# Patient Record
Sex: Female | Born: 1995 | Race: Black or African American | Hispanic: No | Smoking: Never smoker
Health system: Southern US, Community
[De-identification: ages and names within clinical notes are randomized; demographics above are authoritative.]

## PROBLEM LIST (undated history)

## (undated) DIAGNOSIS — E119 Type 2 diabetes mellitus without complications: Secondary | ICD-10-CM

## (undated) HISTORY — PX: TONSILLECTOMY: SHX5618

## (undated) HISTORY — PX: CHOLECYSTECTOMY: SHX55

---

## 2018-11-06 ENCOUNTER — Other Ambulatory Visit: Payer: Self-pay

## 2018-11-06 ENCOUNTER — Emergency Department (HOSPITAL_COMMUNITY)
Admission: EM | Admit: 2018-11-06 | Discharge: 2018-11-07 | Disposition: A | Discharge status: Home / Self Care | Payer: PRIVATE HEALTH INSURANCE | Attending: Emergency Medicine | Admitting: Emergency Medicine

## 2018-11-06 ENCOUNTER — Emergency Department (HOSPITAL_COMMUNITY): Payer: PRIVATE HEALTH INSURANCE

## 2018-11-06 ENCOUNTER — Encounter (HOSPITAL_COMMUNITY): Payer: Self-pay | Admitting: Emergency Medicine

## 2018-11-06 DIAGNOSIS — E119 Type 2 diabetes mellitus without complications: Secondary | ICD-10-CM | POA: Insufficient documentation

## 2018-11-06 DIAGNOSIS — R0789 Other chest pain: Secondary | ICD-10-CM | POA: Diagnosis present

## 2018-11-06 DIAGNOSIS — R5383 Other fatigue: Secondary | ICD-10-CM | POA: Diagnosis not present

## 2018-11-06 DIAGNOSIS — R0602 Shortness of breath: Secondary | ICD-10-CM | POA: Insufficient documentation

## 2018-11-06 HISTORY — DX: Type 2 diabetes mellitus without complications: E11.9

## 2018-11-06 LAB — CBC
HCT: 39 % (ref 36.0–46.0)
Hemoglobin: 12.3 g/dL (ref 12.0–15.0)
MCH: 27.7 pg (ref 26.0–34.0)
MCHC: 31.5 g/dL (ref 30.0–36.0)
MCV: 87.8 fL (ref 80.0–100.0)
Platelets: 479 10*3/uL — ABNORMAL HIGH (ref 150–400)
RBC: 4.44 MIL/uL (ref 3.87–5.11)
RDW: 14.4 % (ref 11.5–15.5)
WBC: 11 10*3/uL — ABNORMAL HIGH (ref 4.0–10.5)
nRBC: 0 % (ref 0.0–0.2)

## 2018-11-06 MED ORDER — SODIUM CHLORIDE 0.9% FLUSH: 3.0000 mL | Freq: Once | INTRAVENOUS | Status: DC

## 2018-11-06 NOTE — ED Triage Notes (Signed)
Patient reports intermittent upper chest pain with mild SOB onset this week , pain increases with deep inspiration , denies emesis or diaphoresis , patient added RUQ abdominal pain today .

## 2018-11-07 LAB — LIPASE, BLOOD: Lipase: 30 U/L (ref 11–51)

## 2018-11-07 LAB — BASIC METABOLIC PANEL
Anion gap: 9 (ref 5–15)
BUN: 9 mg/dL (ref 6–20)
CO2: 23 mmol/L (ref 22–32)
Calcium: 9.3 mg/dL (ref 8.9–10.3)
Chloride: 104 mmol/L (ref 98–111)
Creatinine, Ser: 0.72 mg/dL (ref 0.44–1.00)
GFR calc Af Amer: >60 mL/min (ref >60)
GFR calc non Af Amer: >60 mL/min (ref >60)
Glucose, Bld: 80 mg/dL (ref 70–99)
Potassium: 3.5 mmol/L (ref 3.5–5.1)
Sodium: 136 mmol/L (ref 135–145)

## 2018-11-07 LAB — I-STAT BETA HCG BLOOD, ED (MC, WL, AP ONLY): I-stat hCG, quantitative: <5 m[IU]/mL (ref <5)

## 2018-11-07 LAB — PROTIME-INR
INR: 1 (ref 0.8–1.2)
Prothrombin Time: 13.5 seconds (ref 11.4–15.2)

## 2018-11-07 LAB — TROPONIN I (HIGH SENSITIVITY): Troponin I (High Sensitivity): 4 ng/L (ref <18)

## 2018-11-07 NOTE — ED Provider Notes (Signed)
[facility] EMERGENCY DEPARTMENT Provider Note   CSN: [id number] Arrival date & time: 11/06/18  2209     History   Chief Complaint Chief Complaint  Patient presents with  . Chest Pain  . Abdominal Pain    HPI Nicole Stout is a 23 y.o. female with no past medical history presents for 1 week of fatigue with 2 days of chest tightness that is constant, gradual in onset, mild discomfort, associated shortness of breath unchanged with exertion.  Chest pain is worse in certain positions and with certain movements.  Better with Tylenol.   Patient states that she has felt generally fatigued over the past week and temperature regularly and states that at 1 point it was 99.2.  Patient states last Tylenol was yesterday afternoon.  Patient also endorses right upper quadrant discomfort that is constant, achy and mild.  Patient denies any nausea, vomiting, diarrhea, denies any pain with walking.   Patient had a cholecystectomy 6 years ago.  No other abdominal surgeries last bowel movement was today no blood or unusual appearance of stool.  No pain with defecation.  No urinary symptoms, hematuria, burning of urination, change in frequency, no pelvic or vaginal pain, no concern for STI or UTI.  Patient states she has not eaten since being in the ED however she is able to tolerate drinking and eating and denies any pain associated with oral intake.  Patient states she is gratitude and has felt stressed due to school recently.  She states that it is a difficult time and that her schedule is stressful for her.    HPI  Past Medical History:  Diagnosis Date  . Diabetes mellitus without complication (HCC)     There are no active problems to display for this patient.   History reviewed. No pertinent surgical history.   OB History   No obstetric history on file.      Home Medications    Prior to Admission medications   Medication Sig Start Date End Date Taking?  Authorizing Provider  BIOTIN PO Take 1 tablet by mouth daily.   Yes [provider]  cholecalciferol (VITAMIN D3) 25 MCG (1000 UT) tablet Take 1,000 Units by mouth daily.   Yes [provider]  ELDERBERRY PO Take 1 tablet by mouth daily.   Yes [provider]  TRI-LO-MARZIA 0.18/0.215/0.25 MG-25 MCG tab Take 1 tablet by mouth daily. 09/20/18  Yes [provider]  vitamin B-12 (CYANOCOBALAMIN) 1000 MCG tablet Take 1,000 mcg by mouth daily.   Yes [provider]    Family History No family history on file.  Social History Social History   Tobacco Use  . Smoking status: Never Smoker  . Smokeless tobacco: Never Used  Substance Use Topics  . Alcohol use: Never    Frequency: Never  . Drug use: Never     Allergies   Patient has no known allergies.   Review of Systems Review of Systems  All other systems reviewed and are negative.    Physical Exam Updated Vital Signs BP 129/73   Pulse (!) 59   Temp 98.3 F (36.8 C) (Oral)   Resp 18   LMP 09/22/2018 (Approximate)   SpO2 100%    Vitals:   11/07/18 0715 11/07/18 0745  BP: (!) 158/94 (!) 135/93  Pulse: 76 76  Resp: 15 20  Temp:    SpO2: 100% 100%     Physical Exam Vitals signs and nursing note reviewed.  Constitutional:  General: She is not in acute distress.    Appearance: She is obese. She is not ill-appearing.  HENT:     Head: Normocephalic and atraumatic.     Nose: Nose normal.     Mouth/Throat:     Mouth: Mucous membranes are moist.  Eyes:     General: No scleral icterus. Neck:     Musculoskeletal: Normal range of motion. No neck rigidity.  Cardiovascular:     Rate and Rhythm: Normal rate and regular rhythm.     Pulses: Normal pulses.     Heart sounds: Normal heart sounds. No murmur. No friction rub. No gallop.   Pulmonary:     Effort: Pulmonary effort is normal. No respiratory distress.     Breath sounds: Normal breath sounds. No wheezing, rhonchi or  rales.  Chest:     Chest wall: No tenderness.  Abdominal:     General: Abdomen is flat. Bowel sounds are normal. There is no distension.     Palpations: Abdomen is soft.     Tenderness: There is no abdominal tenderness. There is no right CVA tenderness, left CVA tenderness, guarding or rebound.  Musculoskeletal:     Right lower leg: No edema.     Left lower leg: No edema.     Comments: Has reproducible chest wall tenderness over the sternum and left-sided pectoral muscle.  Skin:    General: Skin is warm and dry.     Capillary Refill: Capillary refill takes less than 2 seconds.  Neurological:     Mental Status: She is alert. Mental status is at baseline.  Psychiatric:        Mood and Affect: Mood normal.        Behavior: Behavior normal.      ED Treatments / Results  Labs (all labs ordered are listed, but only abnormal results are displayed) Labs Reviewed  CBC - Abnormal; Notable for the following components:      Result Value   WBC 11.0 (*)    Platelets 479 (*)    All other components within normal limits  BASIC METABOLIC PANEL  PROTIME-INR  LIPASE, BLOOD  I-STAT BETA HCG BLOOD, ED ([facility], [facility], [facility] ONLY)  TROPONIN I (HIGH SENSITIVITY)  TROPONIN I (HIGH SENSITIVITY)    EKG EKG Interpretation  Date/Time:  Tuesday November 06 2018 22:19:36 EDT Ventricular Rate:  84 PR Interval:  146 QRS Duration: 86 QT Interval:  360 QTC Calculation: 425 R Axis:   88 Text Interpretation:  Normal sinus rhythm Normal ECG NO STEMI. No old tracing to compare Confirmed by Drema Pryardama, Pedro [phone]([id number]) on 11/07/2018 3:11:23 AM   Radiology Dg Chest 2 View  Result Date: 11/06/2018 CLINICAL DATA:  23 year old female with chest pain. EXAM: CHEST - 2 VIEW COMPARISON:  None. FINDINGS: The heart size and mediastinal contours are within normal limits. Both lungs are clear. The visualized skeletal structures are unremarkable. IMPRESSION: No active cardiopulmonary disease. Electronically Signed   By: Elgie CollardArash   Radparvar M.D.   On: 11/06/2018 23:08    Procedures Procedures (including critical care time)  Medications Ordered in ED Medications  sodium chloride flush (NS) 0.9 % injection 3 mL (has no administration in time range)     Initial Impression / Assessment and Plan / ED Course  I have reviewed the triage vital signs and the nursing notes.  Pertinent labs & imaging results that were available during my care of the patient were reviewed by me and considered in my medical decision making (see  chart for details).       Patient is a healthy 23 year old female with gradual onset of fatigue, chest pain and abdominal pain over the past week.   Lab work shows normal electrolytes, patient is nonpregnant, lipase within normal limits, troponin within normal limits, WBC barely mildly elevated at 11.0, this x-ray shows no cardiopulmonary disease, no enlarging of heart, no focal consolidation, EKG is unremarkable with no concerning features, no need for second troponin.  Patient has no cardiac risk factors other than family history of daily cardiomyopathy.   Has reassuring physical exam reproducible chest wall tenderness and patient is a non-smoker, doubt PE as patient has no unilateral leg swelling, was not taking her birth control when symptoms began, no history of DVT, no cough or hemoptysis.   Abdominal pain is nonfocal on exam and patient has had a cholecystectomy.  Doubt pneumonia, patient is not having fevers and chest x-ray is within normal limits.  Discussed plan to discharge patient with close up with primary care with patient.  Patient is agreeable to plan patient will use ibuprofen for discomfort in the meantime.  States she will return promptly to the ED if she has worsening shortness of breath, worsening abdominal pain, fever, or any concerning symptoms.       The patient appears reasonably screened and/or stabilized for discharge and I doubt any other medical condition or other [facility]  requiring further screening, evaluation, or treatment in the ED at this time prior to discharge.  Patient is hemodynamically stable, in NAD, and able to ambulate in the ED. Pain has been managed or a plan has been made for home management and has no complaints prior to discharge. Patient is comfortable with above plan and is stable for discharge at this time. All questions were answered prior to disposition. Results from the ER workup discussed with the patient face to face and all questions answered to the best of my ability. The patient is safe for discharge with strict return precautions. Patient appears safe for discharge with appropriate follow-up.  Conveyed my impression with the patient and he voiced understanding and is agreeable to plan.   An After Visit Summary was printed and given to the patient.  Portions of this note were generated with [employer]. Dictation errors may occur despite best attempts at proofreading.      Final Clinical Impressions(s) / ED Diagnoses   Final diagnoses:  None    ED Discharge Orders    None       Tedd Sias, [place] 11/07/18 1751    Quintella Reichert, MD 11/09/18 [phone]

## 2018-11-07 NOTE — ED Notes (Signed)
2nd Troponin discontinued per EDP.

## 2018-11-07 NOTE — ED Notes (Signed)
Pt not in room.

## 2018-11-07 NOTE — Discharge Instructions (Addendum)
Please call your insurance to find a primary care provider who is within your network.  Please establish care next week to have a care.  Call  wellness (attached information) if you are unable to make an appointment within the next week with primary care.  Please return to ED if you have any worsening shortness of breath, fevers, vomiting, worsening or concerning symptoms.  Take ibuprofen and tylenol for pain and monitor symptoms closely. Please return if concerned.

## 2018-11-07 NOTE — ED Notes (Signed)
Attempted IV stick times 2 and unsuccessful. Patient may need US guided IV placement

## 2019-04-16 ENCOUNTER — Ambulatory Visit (HOSPITAL_COMMUNITY)
Admission: EM | Admit: 2019-04-16 | Discharge: 2019-04-16 | Disposition: A | Discharge status: Home / Self Care | Payer: PRIVATE HEALTH INSURANCE

## 2019-04-16 ENCOUNTER — Other Ambulatory Visit: Payer: Self-pay

## 2019-04-17 ENCOUNTER — Telehealth (HOSPITAL_COMMUNITY): Payer: Self-pay

## 2021-04-20 IMAGING — DX DG CHEST 2V
2 series · 2 of 2 positions shown · non-contrast
Comparison: None.

CLINICAL DATA: 22-year-old female with chest pain.

EXAM:
CHEST - 2 VIEW

[chest pa]
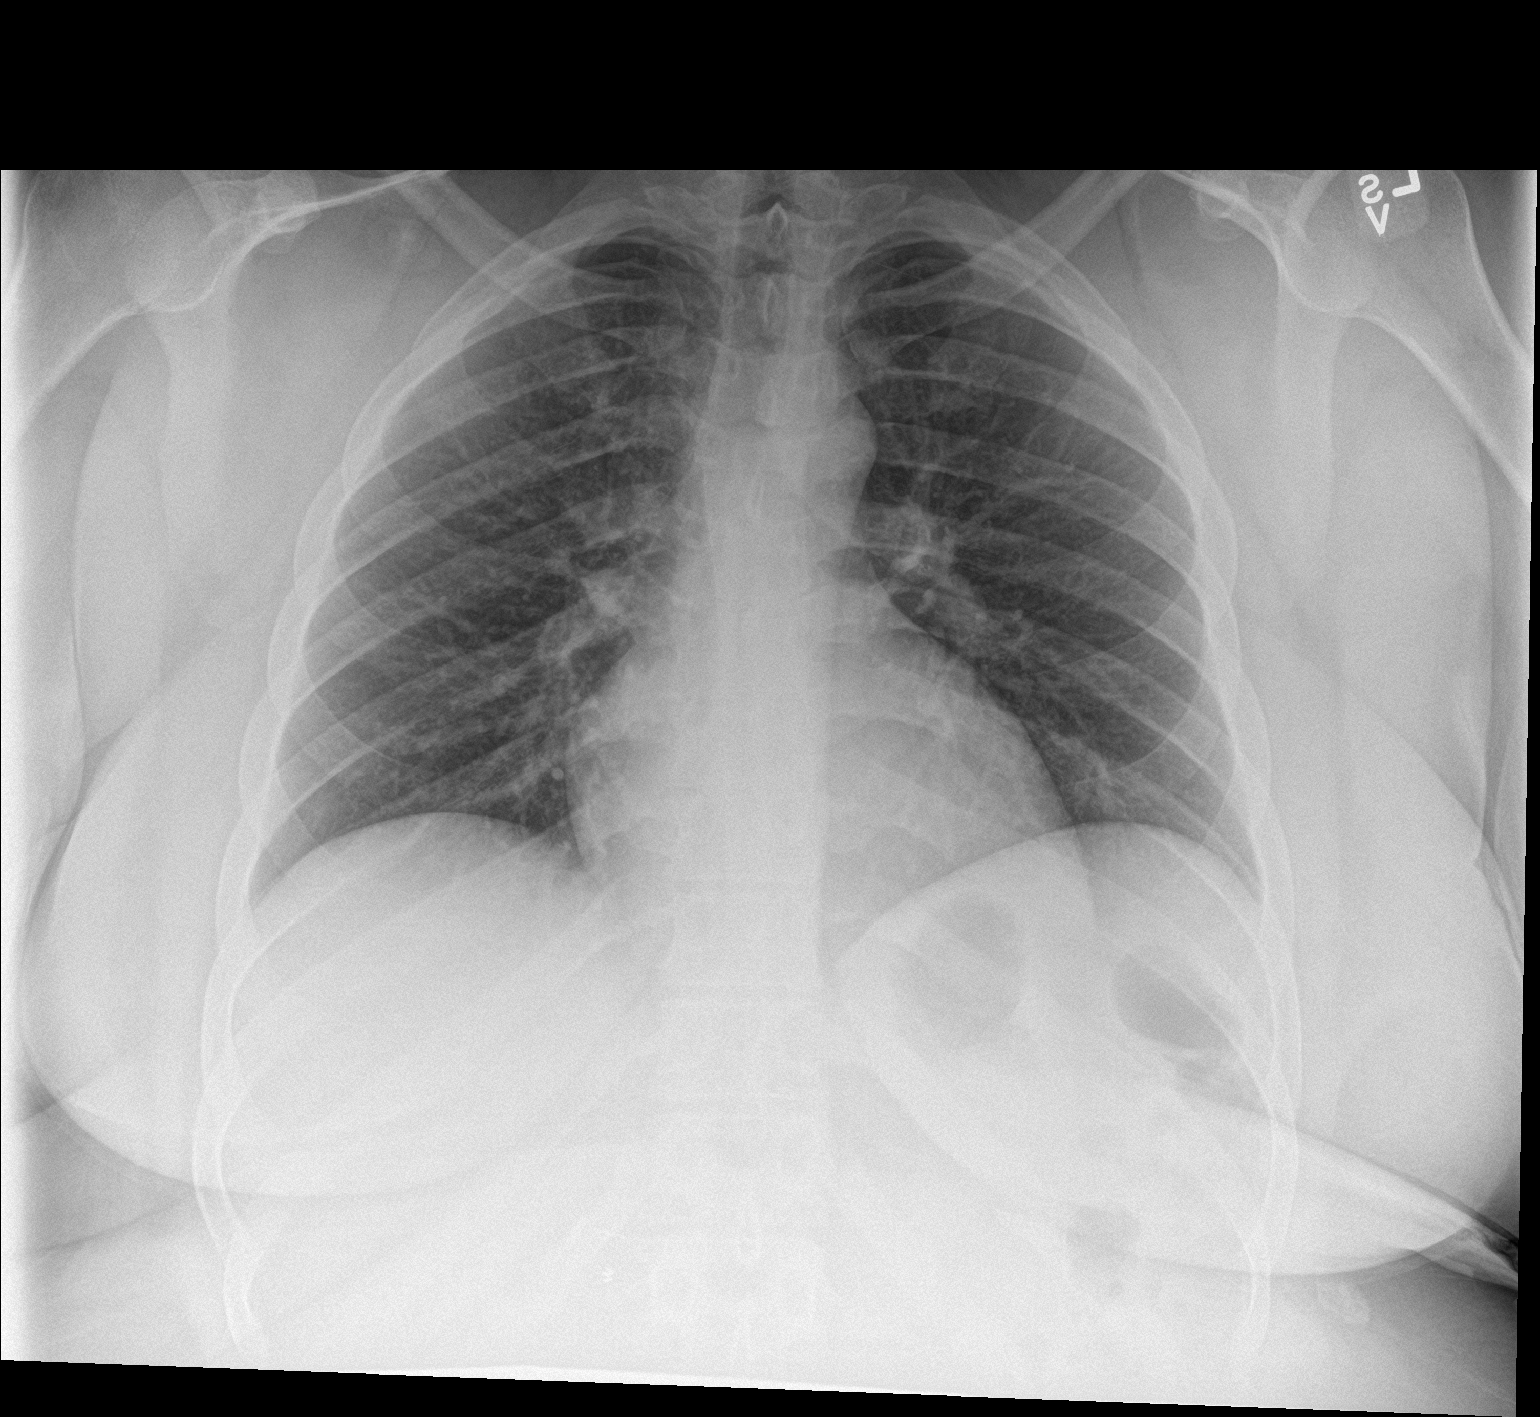

[chest lat]
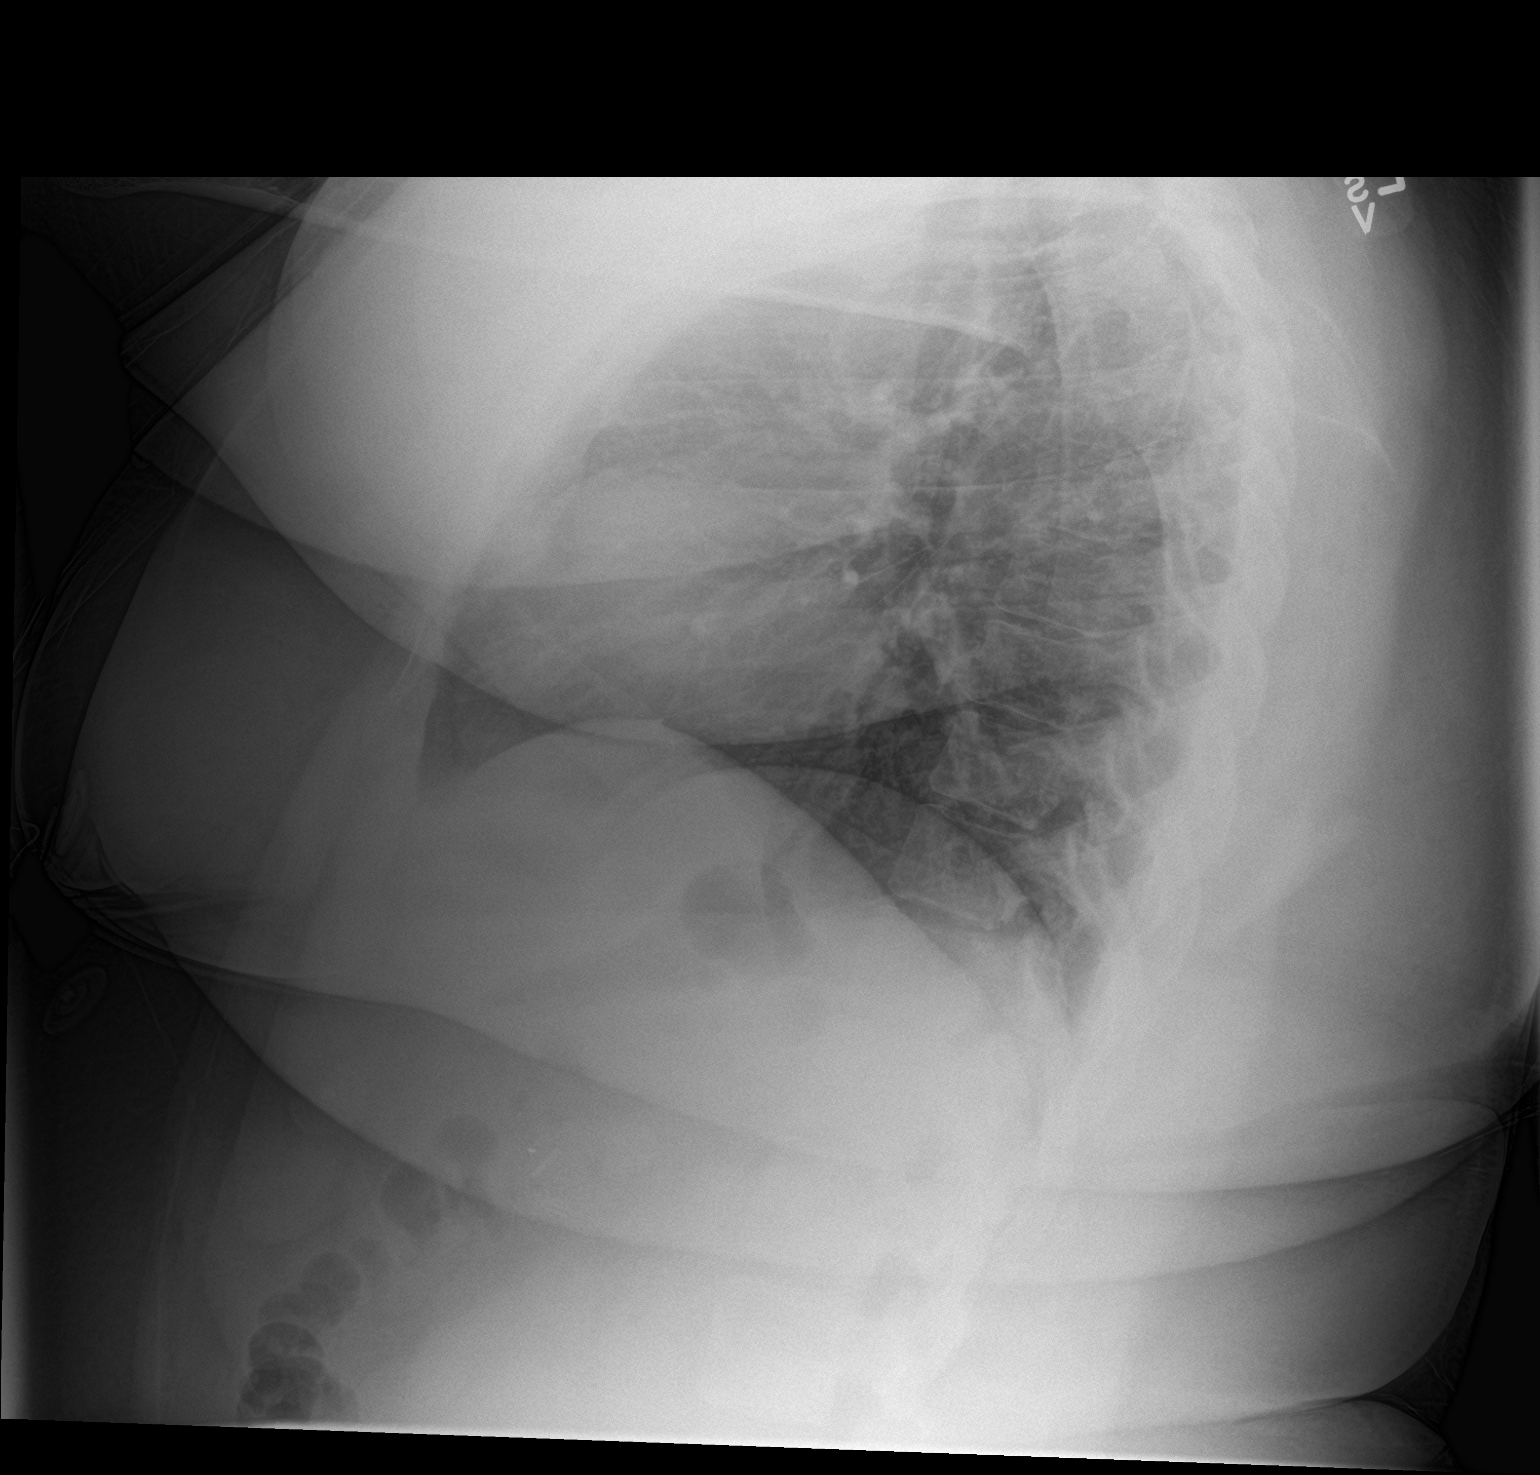

[2 of 2 positions shown; findings below may reference images not displayed]

FINDINGS: The heart size and mediastinal contours are within normal limits.
Both lungs are clear. The visualized skeletal structures are
unremarkable.
IMPRESSION: No active cardiopulmonary disease.

## 2021-10-30 ENCOUNTER — Emergency Department: Payer: Commercial Managed Care - POS

## 2021-10-30 ENCOUNTER — Emergency Department
Admission: EM | Admit: 2021-10-30 | Discharge: 2021-10-31 | Disposition: A | Discharge status: Home / Self Care | Payer: Commercial Managed Care - POS | Attending: Student in an Organized Health Care Education/Training Program | Admitting: Student in an Organized Health Care Education/Training Program

## 2021-10-30 DIAGNOSIS — R079 Chest pain, unspecified: Secondary | ICD-10-CM | POA: Insufficient documentation

## 2021-10-30 DIAGNOSIS — R1031 Right lower quadrant pain: Secondary | ICD-10-CM | POA: Insufficient documentation

## 2021-10-30 LAB — COMPREHENSIVE METABOLIC PANEL
ALT: 22 U/L (ref 0–55)
AST (SGOT): 18 U/L (ref 5–41)
Albumin/Globulin Ratio: 1 (ref 0.9–2.2)
Albumin: 4 g/dL (ref 3.5–5.0)
Alkaline Phosphatase: 91 U/L (ref 37–117)
Anion Gap: 10 (ref 5.0–15.0)
BUN: 13 mg/dL (ref 7.0–21.0)
Bilirubin, Total: 0.4 mg/dL (ref 0.2–1.2)
CO2: 23 mEq/L (ref 17–29)
Calcium: 9.4 mg/dL (ref 8.5–10.5)
Chloride: 105 mEq/L (ref 99–111)
Creatinine: 0.7 mg/dL (ref 0.4–1.0)
Globulin: 4.1 g/dL — ABNORMAL HIGH (ref 2.0–3.6)
Glucose: 93 mg/dL (ref 70–100)
Potassium: 4.1 mEq/L (ref 3.5–5.3)
Protein, Total: 8.1 g/dL (ref 6.0–8.3)
Sodium: 138 mEq/L (ref 135–145)
eGFR: >60 mL/min/{1.73_m2} (ref >=60)

## 2021-10-30 LAB — CBC AND DIFFERENTIAL
Absolute NRBC: 0 10*3/uL (ref 0.00–0.00)
Basophils Absolute Automated: 0.03 10*3/uL (ref 0.00–0.08)
Basophils Automated: 0.4 %
Eosinophils Absolute Automated: 0.11 10*3/uL (ref 0.00–0.44)
Eosinophils Automated: 1.3 %
Hematocrit: 37.4 % (ref 34.7–43.7)
Hgb: 12 g/dL (ref 11.4–14.8)
Immature Granulocytes Absolute: 0.01 10*3/uL (ref 0.00–0.07)
Immature Granulocytes: 0.1 %
Instrument Absolute Neutrophil Count: 3.93 10*3/uL (ref 1.10–6.33)
Lymphocytes Absolute Automated: 3.53 10*3/uL — ABNORMAL HIGH (ref 0.42–3.22)
Lymphocytes Automated: 42.9 %
MCH: 26.3 pg (ref 25.1–33.5)
MCHC: 32.1 g/dL (ref 31.5–35.8)
MCV: 81.8 fL (ref 78.0–96.0)
MPV: 9.3 fL (ref 8.9–12.5)
Monocytes Absolute Automated: 0.62 10*3/uL (ref 0.21–0.85)
Monocytes: 7.5 %
Neutrophils Absolute: 3.93 10*3/uL (ref 1.10–6.33)
Neutrophils: 47.8 %
Nucleated RBC: 0 /100 WBC (ref 0.0–0.0)
Platelets: 395 10*3/uL — ABNORMAL HIGH (ref 142–346)
RBC: 4.57 10*6/uL (ref 3.90–5.10)
RDW: 15 % (ref 11–15)
WBC: 8.23 10*3/uL (ref 3.10–9.50)

## 2021-10-30 LAB — URINALYSIS REFLEX TO MICROSCOPIC EXAM - REFLEX TO CULTURE
Bilirubin, UA: NEGATIVE
Blood, UA: NEGATIVE
Glucose, UA: NEGATIVE
Ketones UA: NEGATIVE
Leukocyte Esterase, UA: NEGATIVE
Nitrite, UA: NEGATIVE
Protein, UR: NEGATIVE
Specific Gravity UA: 1.026 (ref 1.001–1.035)
Urine pH: 5 (ref 5.0–8.0)
Urobilinogen, UA: NEGATIVE mg/dL (ref 0.2–2.0)

## 2021-10-30 LAB — HIGH SENSITIVITY TROPONIN-I: hs Troponin-I: 11.1 ng/L

## 2021-10-30 LAB — LIPASE: Lipase: 17 U/L (ref 8–78)

## 2021-10-30 LAB — HCG QUANTITATIVE: hCG, Quant.: <2.4 m[IU]/mL

## 2021-10-30 MED ORDER — IOHEXOL 350 MG/ML IV SOLN
100.0000 mL | Freq: Once | INTRAVENOUS | Status: AC | PRN
Start: 2021-10-30 — End: 2021-10-30
  Administered 2021-10-30: 100 mL via INTRAVENOUS

## 2021-10-30 NOTE — ED Triage Notes (Signed)
Joanne Cervantes is a 26 y.o. female  Presenting to the ED c/o pelvic, abdominal, and chest pain. Patient states she has been having pelvic pain and vaginal spotting. Patient endorses Bilateral upper abdominal pain. Patient also endorsing left sided chest pain starting today. Patient denies N/V/D.

## 2021-10-30 NOTE — ED Provider Notes (Signed)
EMERGENCY DEPARTMENT HISTORY AND PHYSICAL EXAM     None        Date: 10/30/2021  Patient Name: Joanne Cervantes    This note was generated by the Epic EMR system/ Dragon speech recognition and may contain inherent errors or omissions not intended by the user. Grammatical errors, random word insertions, deletions and pronoun errors  are occasional consequences of this technology due to software limitations. Not all errors are caught or corrected. If there are questions or concerns about the content of this note or information contained within the body of this dictation they should be addressed directly with the author for clarification.    History of Presenting Illness     Chief Complaint   Patient presents with    Abdominal Pain    Chest Pain    Pelvic Pain       History Provided By: Patient    Chief Complaint: abdominal pain, chest pain  Pertinent Negatives: see ROS    Additional History: Joanne Cervantes is a 26 y.o. female presenting with multiple complaints including right lower quadrant pain and chest pain.  Describes intermittent, sharp pain starts in right lower quadrant that radiates diffusely.  Symptoms worsened after eating. Also reports left-sided chest pain.  No shortness of breath.  No history of blood clots.  No OCP use.  No leg swelling or leg pain.  No emesis.      I reviewed patient's last ED visit, clinic visit or admission/discharge summary, as well as associated recent EKGs, lab or imaging results, if applicable.     PCP: Pcp, None, MD    Past History   Past Medical History:  History reviewed. No pertinent past medical history.    Past Surgical History:  Past Surgical History:   Procedure Laterality Date    CHOLECYSTECTOMY      TONSILLECTOMY         Family History:  History reviewed. No pertinent family history.    Social History:  Social History     Tobacco Use    Smoking status: Never    Smokeless tobacco: Never   Vaping Use    Vaping Use: Never used   Substance Use Topics    Alcohol use: Never     Drug use: Never       Allergies:  No Known Allergies    Review of Systems     Review of Systems    Pertinent ROS as noted above in HPI    Physical Exam   BP (!) 148/94   Pulse 94   Temp 98.4 F (36.9 C) (Oral)   Resp 16   Ht '5\' 6"'$  (1.676 m)   Wt 155.1 kg   SpO2 98%   BMI 55.20 kg/m     Physical Exam  Vitals and nursing note reviewed.   Constitutional:       Appearance: She is not toxic-appearing.   HENT:      Head: Normocephalic and atraumatic.   Cardiovascular:      Rate and Rhythm: Normal rate.      Pulses: Normal pulses.   Pulmonary:      Effort: Pulmonary effort is normal. No respiratory distress.   Abdominal:      General: There is no distension.      Tenderness: There is abdominal tenderness in the right lower quadrant.   Musculoskeletal:         General: No deformity.   Skin:     General: Skin is warm and  dry.   Neurological:      Mental Status: She is alert. Mental status is at baseline.           Diagnostic Study Results     Labs -  Reviewed, if relevant to the case.    Radiologic Studies -   Reviewed, if relevant to the case.      Medical Decision Making   I am the first provider for this patient.    I reviewed the vital signs, available nursing notes, past medical history, past surgical history, family history and social history.    Vital Signs: Reviewed the patient's vital signs during ED stay.    I reviewed pt's pulse oxymetry and cardiac monitor values, as relevant to the case.    Pulse Oximetry Analysis:  98% on RA - Normal    Cardiac Monitor:  Rhythm:  Normal Sinus, Rate:  Normal, Ectopy:  None    Personal Protective Equipment (PPE)  Gloves, N95 and surgical mask.    Record Review: The following, if applicable to the case, were reviewed and noted:    Old medical records.  Nursing notes.  Outside records.     EKG:  Interpreted by this Emergency Physician.  EKG: NSR 80s, no STE's or STD's, non-specific twave pattern, no STEMI, QTC 418    Clinical Decision Support:   PERC  negative    Critical Care Time:       Procedures:      Provider Notes/MDM:   Patient is a 26y.o female who presented to the ED with abdominal pain, as per HPI above. On presentation, pt is nontoxic appearing, HDS, VSS.  Differentials considered but not limited to: colitis, pancreatitis, perforated viscus, bowel ischemia, volvulus, appendicitis, ovarian torsion, hemorrhagic cyst. Labs ordered and reviewed: grossly unremarkable. UA negative. Cardiac workup negative. CXR independently interpreted by me negative for acute cardiopulmonary pathology; confirmed by radiologist. CT A/P negative for appendicitis. TVUS with nabothian cyst; no torsion. See report below. Discussed results with patient with plan to follow up with gyne. Pt agrees with plan.     I have considered the above differentials. I have weighed the risks and benefit of further testing and evaluation in the context of patient complaint and believe that further testing, including imaging and/or labs are not warranted at this time.     Medical Decision Making  Amount and/or Complexity of Data Reviewed  Radiology: ordered. Decision-making details documented in ED Course.    Risk  Prescription drug management.         ED Course:   ED Course as of 11/06/21 1457   Sat Oct 30, 2021   2349 CT Abd/ Pelvis with IV Contrast  IMPRESSION:         Normal appendix.     No evidence of obstructive uropathy.     No diverticulitis or colitis. [MS]   Sun Oct 31, 2021   0021 US Transvaginal Non OB with LTD Doppler (Ovarian Torsion)  IMPRESSION:         Slight thickening of the endometrial echocomplex noted, with no endometrial  vascularity.     Nabothian cyst noted.     Ovarian vascularity identified bilaterally.     No evidence of an adnexal mass or free fluid. [MS]   [id number] CXR independently interpreted by me negative for acute cardiothoracic process  [MS]   0105 XR Chest  AP Portable     IMPRESSION:         Normal chest radiograph. [MS]  ED Course User Index  [MS] Jonathon Bellows, MD        Diagnosis     Clinical Impression:   1. Right lower quadrant abdominal pain    2. Chest pain, unspecified type        Disposition:   ED Disposition       ED Disposition   Discharge    Condition   --    Date/Time   Sun Oct 31, 2021 12:49 AM    Comment   Lendon Colonel discharge to home/self care.    Condition at disposition: Stable                 The above diagnostic process was due to medical necessity based on risk stratification of potential harm of patient's presenting complaint.     Attestations: This note is prepared by Levy Pupa, MD. I am the first provider for this patient.        Jonathon Bellows, MD  11/06/21 [id number]

## 2021-10-30 NOTE — ED Triage Notes (Addendum)
[facility] EMERGENCY DEPARTMENT  Provider in Triage Note        Patient Name: Joanne Cervantes    Chief Complaint:   Chief Complaint   Patient presents with    Abdominal Pain    Chest Pain    Pelvic Pain       HPI: Joanne Cervantes is a 26 y.o. female, who has had a rapid medical screening evaluation initiated by myself. Pelvic and abdominal pain x yesterday. Also with some mild chest pain x yesterday as well. States she recently started vaginal bleeding; started her menstrual cycle. No diarrhea, constipation, nausea or vomiting. Denies fevers. Denies dysuria.    Pt has no pain currently.   Hx irregular cycles    PMHx HTN not currently on meds; stopped HTN meds after stopping OCPs (states HTN resolved)     Medical/Surgical/Social history: as per HPI    Vitals: BP (!) 180/139   Pulse 98   Temp 98 F (36.7 C) (Oral)   Resp 18   Ht '5\' 6"'$  (1.676 m)   Wt 155.1 kg   SpO2 98%   BMI 55.20 kg/m     Pertinent brief exam:   Examination of area of concern:   NAD  Ambulatory   No resp distress       Preliminary orders: CBC CMP Troponin EKG BHCG Lipase UA    Symptom based preliminary diagnosis/MDM: pelvic pain, abdominal pain, vaginal bleeding, chest pain    Patient advised to remain in the ED until further evaluation can be performed. Patient instructed to notify staff of any changes in condition while waiting.  This assessment is an initial evaluation to expedite care.

## 2021-10-31 ENCOUNTER — Emergency Department: Payer: Commercial Managed Care - POS

## 2021-10-31 LAB — ECG 12-LEAD
Atrial Rate: 88 {beats}/min
IHS MUSE NARRATIVE AND IMPRESSION: NORMAL
P Axis: 49 degrees
P-R Interval: 152 ms
Q-T Interval: 346 ms
QRS Duration: 88 ms
QTC Calculation (Bezet): 418 ms
R Axis: 51 degrees
T Axis: 48 degrees
Ventricular Rate: 88 {beats}/min

## 2021-10-31 LAB — HIGH SENSITIVITY TROPONIN-I WITH DELTA
hs Troponin-I Delta: UNDETERMINED ng/L
hs Troponin-I: <2.7 ng/L
